# Patient Record
Sex: Male | Born: 1975 | Race: White | Hispanic: No | Marital: Single | Smoking: Never smoker
Health system: Southern US, Community
[De-identification: ages and names within clinical notes are randomized; demographics above are authoritative.]

## PROBLEM LIST (undated history)

## (undated) DIAGNOSIS — I10 Essential (primary) hypertension: Secondary | ICD-10-CM

## (undated) DIAGNOSIS — E785 Hyperlipidemia, unspecified: Secondary | ICD-10-CM

## (undated) HISTORY — DX: Hyperlipidemia, unspecified: E78.5

---

## 2007-06-12 ENCOUNTER — Emergency Department (HOSPITAL_COMMUNITY): Admission: EM | Admit: 2007-06-12 | Discharge: 2007-06-12 | Payer: Self-pay | Admitting: Emergency Medicine

## 2019-09-24 ENCOUNTER — Other Ambulatory Visit: Payer: Self-pay

## 2019-09-24 ENCOUNTER — Ambulatory Visit
Admission: RE | Admit: 2019-09-24 | Discharge: 2019-09-24 | Disposition: A | Discharge status: Home / Self Care | Payer: BC Managed Care – PPO | Source: Ambulatory Visit | Attending: Family Medicine | Admitting: Family Medicine

## 2019-09-24 ENCOUNTER — Other Ambulatory Visit: Payer: Self-pay | Admitting: Family Medicine

## 2019-09-24 DIAGNOSIS — G8929 Other chronic pain: Secondary | ICD-10-CM

## 2019-09-24 DIAGNOSIS — M25512 Pain in left shoulder: Secondary | ICD-10-CM

## 2019-10-02 ENCOUNTER — Encounter: Payer: Self-pay | Admitting: Physical Therapy

## 2019-10-02 ENCOUNTER — Ambulatory Visit: Payer: BC Managed Care – PPO | Attending: Family Medicine | Admitting: Physical Therapy

## 2019-10-02 ENCOUNTER — Other Ambulatory Visit: Payer: Self-pay

## 2019-10-02 DIAGNOSIS — M25512 Pain in left shoulder: Secondary | ICD-10-CM

## 2019-10-02 DIAGNOSIS — M25612 Stiffness of left shoulder, not elsewhere classified: Secondary | ICD-10-CM

## 2019-10-02 NOTE — Patient Instructions (Signed)
Access Code: [email or web address]  URL: [email or web address]  Date: 10/02/2019  Prepared by: Lum Babe   Exercises Doorway Pec Stretch at 90 Degrees Abduction - 5 reps - 1 sets - 15 hold - 2x daily - 7x weekly Sleeper Stretch - 5 reps - 1 sets - 15 hold - 2x daily - 7x weekly Standing Shoulder Internal Rotation Stretch Behind Back - 5 reps - 1 sets - 15 hold - 2x daily - 7x weekly

## 2019-10-02 NOTE — Therapy (Signed)
[facility] [place] [address] [place], [place], [address] Phone: [phone]   Fax:  [phone]  Physical Therapy Evaluation  Patient Details  Name: Anthony Dean MRN: [id number] Date of Birth: 1976/10/17 Referring Provider (PT): TL Leavy Cella   Encounter Date: 10/02/2019  PT End of Session - 10/02/19 0828    Visit Number  1    Date for PT Re-Evaluation  12/02/19    PT Start Time  0756    PT Stop Time  0835    PT Time Calculation (min)  39 min    Activity Tolerance  Patient tolerated treatment well    Behavior During Therapy  [facility] for tasks assessed/performed       History reviewed. No pertinent past medical history.  History reviewed. No pertinent surgical history.  There were no vitals filed for this visit.   Subjective Assessment - 10/02/19 0756    Subjective  Patient reports that he has had some left shoulder pain over the years, reports recently the pain has been more consistent and worse at night.  He is right handed, x-rays were negative.    Patient Stated Goals  less pain and sleep better    Currently in Pain?  Yes    Pain Score  3     Pain Location  Shoulder    Pain Orientation  Left;Anterior    Pain Descriptors / Indicators  Aching    Pain Type  Acute pain    Pain Radiating Towards  denies    Pain Onset  More than a month ago    Pain Frequency  Constant    Aggravating Factors   reaching sleeping, pain up to 8/10 reports difficulty sleeping    Pain Relieving Factors  not moving it, prednisone at best pain a 3/10    Effect of Pain on Daily Activities  difficulty sleeping         [facility] PT Assessment - 10/02/19 0001      Assessment   Medical Diagnosis  left shoulder pain    Referring Provider (PT)  TL Leavy Cella    Onset Date/Surgical Date  09/18/19    Hand Dominance  Right    Prior Therapy  no      Precautions   Precautions  None      Balance Screen   Has the patient fallen in the past 6 months  No     Has the patient had a decrease in activity level because of a fear of falling?   No    Is the patient reluctant to leave their home because of a fear of falling?   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    Leisure  no exercise      Posture/Postural Control   Posture Comments  fwd head, rounded shoulders      ROM / Strength   AROM / PROM / Strength  AROM;Strength      AROM   AROM Assessment Site  Shoulder    Right/Left Shoulder  Left    Left Shoulder Flexion  150 Degrees    Left Shoulder ABduction  170 Degrees    Left Shoulder Internal Rotation  55 Degrees  Left Shoulder External Rotation  85 Degrees      Strength   Overall Strength Comments  ER and IR 4-/5 with pain, flexion 4+/5, abduction 4-/5 with crepitus and pain      Palpation   Palpation comment  has tightness but not tender      Special Tests   Other special tests  negative biceps test, and impingement test, pain and weak with empty can test                Objective measurements completed on examination: See above findings.      [facility] Adult PT Treatment/Exercise - 10/02/19 0001      Modalities   Modalities  Iontophoresis      Iontophoresis   Type of Iontophoresis  Dexamethasone    Location  left anteriorlateral shoulder    Dose  80 mA    Time  4 hour patch               PT Short Term Goals - 10/02/19 0834      PT SHORT TERM GOAL #1   Title  indepednent with initial HEP    Time  2    Period  Weeks    Status  New        PT Long Term Goals - 10/02/19 16100834      PT LONG TERM GOAL #1   Title  independent with advanced HEP    Time  8    Period  Weeks    Status  New      PT LONG TERM GOAL #2   Title  understand posture and body mechanics    Time  8    Period  Weeks     Status  New      PT LONG TERM GOAL #3   Title  decrease pain 50%    Time  8    Period  Weeks    Status  New      PT LONG TERM GOAL #4   Title  increase left shoulder ROM of flexion to 170 degrees    Period  Weeks    Status  New      PT LONG TERM GOAL #5   Title  increase IR of left shoulder to 80 degrees    Time  8    Period  Weeks    Status  New             Plan - 10/02/19 [id number]    Clinical Impression Statement  Patient reports that he has had pain in the left shoulder previoulsy but would resolve on its own, report that over the past few months he has had more pain and significant pain at night with difficulty sleeping.  He is right handed, x-rays were negative.  He has some limitations in flexion and ER/IR, he is weak with flexion and ER/IR, negative biceps tests, impingement test was negative, empty can test was painful and weak.  Feel like this could be more of an RC tendonitis issue.    Stability/Clinical Decision Making  Stable/Uncomplicated    Clinical Decision Making  Low    Rehab Potential  Good    PT Frequency  2x / week    PT Duration  8 weeks    PT Treatment/Interventions  ADLs/Self Care Home Management;Cryotherapy;Electrical Stimulation;Iontophoresis 4mg /ml Dexamethasone;Moist Heat;Ultrasound;Therapeutic activities;Therapeutic exercise;Neuromuscular re-education;Manual techniques;Patient/family education    PT Next Visit Plan  he is to try felxibility this week and next week  may start scapular stabilization, see if he thought the ionto helped    Consulted and Agree with Plan of Care  Patient       Patient will benefit from skilled therapeutic intervention in order to improve the following deficits and impairments:  Pain, Improper body mechanics, Postural dysfunction, Decreased range of motion, Decreased strength, Impaired UE functional use, Impaired flexibility  Visit Diagnosis: Acute pain of left shoulder - Plan: PT plan of care cert/re-cert  Stiffness of  left shoulder, not elsewhere classified - Plan: PT plan of care cert/re-cert     Problem List There are no active problems to display for this patient.   Sumner Boast., PT 10/02/2019, 8:39 AM  [place] [place], [place], [address] Phone: [phone]   Fax:  [phone]  Name: Anthony Dean MRN: [id number] Date of Birth: August 16, 1976

## 2019-10-09 ENCOUNTER — Encounter: Payer: Self-pay | Admitting: Physical Therapy

## 2019-10-09 ENCOUNTER — Ambulatory Visit: Payer: BC Managed Care – PPO | Admitting: Physical Therapy

## 2019-10-09 ENCOUNTER — Other Ambulatory Visit: Payer: Self-pay

## 2019-10-09 DIAGNOSIS — M25512 Pain in left shoulder: Secondary | ICD-10-CM | POA: Diagnosis not present

## 2019-10-09 DIAGNOSIS — M25612 Stiffness of left shoulder, not elsewhere classified: Secondary | ICD-10-CM

## 2019-10-09 NOTE — Therapy (Signed)
[place], [place], [address] Phone: [phone]   Fax:  [phone]  Physical Therapy Treatment  Patient Details  Name: Anthony Dean MRN: [id number] Date of Birth: July 31, 1976 Referring Provider (PT): TL Luciana Axe   Encounter Date: 10/09/2019  PT End of Session - 10/09/19 0840    Visit Number  2    Date for PT Re-Evaluation  12/02/19    PT Start Time  0800    PT Stop Time  0841    PT Time Calculation (min)  41 min       History reviewed. No pertinent past medical history.  History reviewed. No pertinent surgical history.  There were no vitals filed for this visit.  Subjective Assessment - 10/09/19 0803    Subjective  "Pretty good" No change since evaluation,  Patch did help a little that day.    Currently in Pain?  Yes    Pain Score  3     Pain Location  Shoulder    Pain Orientation  Left                       [facility] Adult PT Treatment/Exercise - 10/09/19 0001      Exercises   Exercises  Shoulder      Shoulder Exercises: Standing   External Rotation  Left;20 reps;Theraband    Theraband Level (Shoulder External Rotation)  Level 2 (Red)    Internal Rotation  Strengthening;Left;20 reps;Theraband    Theraband Level (Shoulder Internal Rotation)  Level 2 (Red)    Flexion  Strengthening;Both;20 reps;Weights    Shoulder Flexion Weight (lbs)  2    ABduction  Strengthening;20 reps;Both;Weights    Shoulder ABduction Weight (lbs)  2    Extension  Strengthening;Both;20 reps;Weights    Extension Weight (lbs)  10    Row  Strengthening;Both;Theraband;20 reps    Theraband Level (Shoulder Row)  Level 4 (Blue)    Other Standing Exercises  AAROM cane flex, ext, IR up back x10       Shoulder Exercises: ROM/Strengthening   UBE (Upper Arm Bike)  L3 2 min each way     Other ROM/Strengthening Exercises  Rows & Lats 20lb 2x10       Modalities   Modalities  Iontophoresis      Iontophoresis   Type of Iontophoresis  Dexamethasone    Location  left anteriorlateral shoulder    Dose  80 mA    Time  4 hour patch      Manual Therapy   Manual Therapy  Passive ROM;Joint mobilization    Manual therapy comments  Good PROM some pain at end range    Joint Mobilization  GH grades 1-2    Passive ROM  L shoulder all directions               PT Short Term Goals - 10/09/19 0805      PT SHORT TERM GOAL #1   Title  indepednent with initial HEP    Status  Achieved        PT Long Term Goals - 10/02/19 4259      PT LONG TERM GOAL #1   Title  independent with advanced HEP    Time  8    Period  Weeks    Status  New      PT LONG TERM GOAL #2   Title  understand posture and body mechanics    Time  8    Period  Weeks    Status  New      PT LONG TERM GOAL #3   Title  decrease pain 50%    Time  8    Period  Weeks    Status  New      PT LONG TERM GOAL #4   Title  increase left shoulder ROM of flexion to 170 degrees    Period  Weeks    Status  New      PT LONG TERM GOAL #5   Title  increase IR of left shoulder to 80 degrees    Time  8    Period  Weeks    Status  New            Plan - 10/09/19 0841    Clinical Impression Statement  Pt tolerated an initial progression to TE well. Tolerable pain reported with UBE and L shoulder internal rotation. Postural cues needed with seated rows. PROM is good, some difficulty relaxing and allowing passive motion. Some pain reported at the end range of flexion that lessened after Jt mobs.    Stability/Clinical Decision Making  Stable/Uncomplicated    Rehab Potential  Good    PT Frequency  2x / week    PT Duration  8 weeks    PT Treatment/Interventions  ADLs/Self Care Home Management;Cryotherapy;Electrical Stimulation;Iontophoresis 4mg /ml Dexamethasone;Moist Heat;Ultrasound;Therapeutic activities;Therapeutic exercise;Neuromuscular re-education;Manual techniques;Patient/family education    PT Next Visit Plan   scapular stabilization continue ionto       Patient will benefit from skilled therapeutic intervention in order to improve the following deficits and impairments:  Pain, Improper body mechanics, Postural dysfunction, Decreased range of motion, Decreased strength, Impaired UE functional use, Impaired flexibility  Visit Diagnosis: Stiffness of left shoulder, not elsewhere classified  Acute pain of left shoulder     Problem List There are no active problems to display for this patient.   , PTA 10/09/2019, 8:44 AM  [facility] [place] [address] [place], [place], [place] Phone: [phone]   Fax:  [phone]  Name: Anthony Dean MRN: Raliegh Ip Date of Birth: [birth year]

## 2019-10-16 ENCOUNTER — Encounter: Payer: Self-pay | Admitting: Physical Therapy

## 2019-10-16 ENCOUNTER — Ambulatory Visit: Payer: BC Managed Care – PPO | Admitting: Physical Therapy

## 2019-10-16 ENCOUNTER — Other Ambulatory Visit: Payer: Self-pay

## 2019-10-16 DIAGNOSIS — M25512 Pain in left shoulder: Secondary | ICD-10-CM

## 2019-10-16 DIAGNOSIS — M25612 Stiffness of left shoulder, not elsewhere classified: Secondary | ICD-10-CM

## 2019-10-16 NOTE — Therapy (Signed)
[place] [address] [place], [place], [address] Phone: [phone]   Fax:  [phone]  Physical Therapy Treatment  Patient Details  Name: Anthony Dean MRN: [id number] Date of Birth: 09-15-1976 Referring Provider (PT): TL Luciana Axe   Encounter Date: 10/16/2019  PT End of Session - 10/16/19 0836    Visit Number  3    Date for PT Re-Evaluation  12/02/19    PT Start Time  0800    PT Stop Time  0845    PT Time Calculation (min)  45 min       History reviewed. No pertinent past medical history.  History reviewed. No pertinent surgical history.  There were no vitals filed for this visit.  Subjective Assessment - 10/16/19 0800    Subjective  "No more pain than usual"    Currently in Pain?  Yes    Pain Score  4     Pain Location  Shoulder    Pain Orientation  Left                       [facility] Adult PT Treatment/Exercise - 10/16/19 0001      Exercises   Exercises  Shoulder      Shoulder Exercises: Seated   Other Seated Exercises  Bent over rows, ext, and rev flys 5lb 2x10       Shoulder Exercises: Standing   External Rotation  Left;20 reps;Theraband    Theraband Level (Shoulder External Rotation)  Level 3 (Green)    Flexion  Strengthening;Both;20 reps;Weights    Shoulder Flexion Weight (lbs)  3    ABduction  Strengthening    Shoulder ABduction Weight (lbs)  3    Other Standing Exercises  3# PNF 2 sets 10   3# 4 pt stab on wall 12 repes each   Other Standing Exercises  pulley IR/ER Left 2 sets 10   ER 10# IR20#     Shoulder Exercises: ROM/Strengthening   Nustep  L4 x 6 min    Other ROM/Strengthening Exercises  Rows & Lats 25# 2x10       Modalities   Modalities  Iontophoresis      Iontophoresis   Type of Iontophoresis  Dexamethasone    Location  left anteriorlateral shoulder    Dose  80 mA    Time  4 hour patch      Manual Therapy   Manual Therapy  Passive ROM;Joint mobilization     Manual therapy comments  Good PROM some pain at end range    Joint Mobilization  GH grades 1-2    Passive ROM  L shoulder all directions               PT Short Term Goals - 10/16/19 2694      PT SHORT TERM GOAL #1   Title  indepednent with initial HEP    Status  Achieved        PT Long Term Goals - 10/02/19 0834      PT LONG TERM GOAL #1   Title  independent with advanced HEP    Time  8    Period  Weeks    Status  New      PT LONG TERM GOAL #2   Title  understand posture and body mechanics    Time  8    Period  Weeks    Status  New      PT  LONG TERM GOAL #3   Title  decrease pain 50%    Time  8    Period  Weeks    Status  New      PT LONG TERM GOAL #4   Title  increase left shoulder ROM of flexion to 170 degrees    Period  Weeks    Status  New      PT LONG TERM GOAL #5   Title  increase IR of left shoulder to 80 degrees    Time  8    Period  Weeks    Status  New            Plan - 10/16/19 0837    Clinical Impression Statement  increased wt and reps, some pain at end range but tolerated. Full PROM but again pain at end range. Pt consistant at point of pain at AC joint. Educ on posture and avoided prolonged fwd activities. STG met.    PT Treatment/Interventions  ADLs/Self Care Home Management;Cryotherapy;Electrical Stimulation;Iontophoresis 4mg/ml Dexamethasone;Moist Heat;Ultrasound;Therapeutic activities;Therapeutic exercise;Neuromuscular re-education;Manual techniques;Patient/family education    PT Next Visit Plan  scapular stabilization continue ionto. Increase HEP       Patient will benefit from skilled therapeutic intervention in order to improve the following deficits and impairments:  Pain, Improper body mechanics, Postural dysfunction, Decreased range of motion, Decreased strength, Impaired UE functional use, Impaired flexibility  Visit Diagnosis: Acute pain of left shoulder  Stiffness of left shoulder, not elsewhere  classified     Problem List There are no active problems to display for this patient.   Ronald G Pemberton PTA Angela Payseur PTA 10/16/2019, 8:39 AM  [facility] [place] [address] [place], [place], [address] Phone: [phone]   Fax:  [phone]  Name: Anthony Dean MRN: [id number] Date of Birth: 01/09/1976   

## 2019-10-26 ENCOUNTER — Other Ambulatory Visit: Payer: Self-pay

## 2019-10-26 ENCOUNTER — Ambulatory Visit: Payer: BC Managed Care – PPO | Attending: Family Medicine | Admitting: Physical Therapy

## 2019-10-26 DIAGNOSIS — M25612 Stiffness of left shoulder, not elsewhere classified: Secondary | ICD-10-CM | POA: Diagnosis present

## 2019-10-26 DIAGNOSIS — M25512 Pain in left shoulder: Secondary | ICD-10-CM

## 2019-10-26 NOTE — Therapy (Signed)
[facility] [place] [address] [place], , [address] Phone: [phone]   Fax:  [phone]  Physical Therapy Treatment  Patient Details  Name: Anthony Dean MRN: [id number] Date of Birth: 07/11/1976 Referring Provider (PT): TL Boyd   Encounter Date: 10/26/2019  PT End of Session - 10/26/19 0849    Visit Number  4    Date for PT Re-Evaluation  12/02/19    PT Start Time  0800    PT Stop Time  0845    PT Time Calculation (min)  45 min       No past medical history on file.  No past surgical history on file.  There were no vitals filed for this visit.  Subjective Assessment - 10/26/19 0805    Subjective  "doing okay, shoulder is still hurting like crazy". pt states that pain has not decreased and it still keeps him up at night.    Currently in Pain?  Yes    Pain Score  4     Pain Location  Shoulder    Pain Orientation  Left         [facility] PT Assessment - 10/26/19 0001      AROM   Left Shoulder Flexion  156 Degrees    Left Shoulder Internal Rotation  57 Degrees                   [facility] Adult PT Treatment/Exercise - 10/26/19 0001      Shoulder Exercises: Standing   External Rotation  Left;Theraband;10 reps;20 reps    Theraband Level (Shoulder External Rotation)  Level 3 (Green)    Internal Rotation  Strengthening;Left;20 reps;Theraband;10 reps    Theraband Level (Shoulder Internal Rotation)  Level 3 (Green)    Flexion  Strengthening;Both;20 reps;Weights;10 reps    Shoulder Flexion Weight (lbs)  3    ABduction  Strengthening;Both;10 reps;20 reps;Weights    Shoulder ABduction Weight (lbs)  3    Other Standing Exercises  AAROM flexion with cane x20      Shoulder Exercises: ROM/Strengthening   Nustep  L4 x 6 min    Other ROM/Strengthening Exercises  Rows & Lats 25# 2x15      Iontophoresis   Type of Iontophoresis  Dexamethasone    Location  left anteriorlateral shoulder    Dose  80 mA    Time  4 hour patch      Manual Therapy   Manual Therapy  Passive ROM    Manual therapy comments  Good PROM some pain at end range    Passive ROM  L shoulder all directions               PT Short Term Goals - 10/16/19 0838      PT SHORT TERM GOAL #1   Title  indepednent with initial HEP    Status  Achieved        PT Long Term Goals - 10/26/19 0806      PT LONG TERM GOAL #1   Title  independent with advanced HEP    Status  Achieved      PT LONG TERM GOAL #2   Title  understand posture and body mechanics    Status  On-going      PT LONG TERM GOAL #3   Title  decrease pain 50%    Status  On-going      PT LONG TERM GOAL #4   Status  Partially Met        PT LONG TERM GOAL #5   Title  increase IR of left shoulder to 80 degrees    Status  On-going            Plan - 10/26/19 0845    Clinical Impression Statement  pt able to increase reps with rows, lat pulls, and ER/IR. pt is making progress with shoulder flexion, but IR is about the same. pt was measured in supine. PROM looks good with some pain at the end range. pt agreed to the ionto patch and says it seems to be working.    Stability/Clinical Decision Making  Stable/Uncomplicated    Rehab Potential  Good    PT Frequency  2x / week    PT Duration  8 weeks    PT Next Visit Plan  scapular stabilization continue ionto. Increase HEP       Patient will benefit from skilled therapeutic intervention in order to improve the following deficits and impairments:  Pain, Improper body mechanics, Postural dysfunction, Decreased range of motion, Decreased strength, Impaired UE functional use, Impaired flexibility  Visit Diagnosis: Acute pain of left shoulder  Stiffness of left shoulder, not elsewhere classified     Problem List There are no active problems to display for this patient.   Savannah Benton, SPTA 10/26/2019, 8:50 AM  [facility] [address] , [place], [address] Phone: [phone]   Fax:  [phone]  Name: Siyon Asche MRN: [id number] Date of Birth: 06/02/1976   

## 2019-11-02 ENCOUNTER — Ambulatory Visit: Payer: BC Managed Care – PPO | Admitting: Physical Therapy

## 2019-11-02 ENCOUNTER — Encounter: Payer: Self-pay | Admitting: Physical Therapy

## 2019-11-02 ENCOUNTER — Other Ambulatory Visit: Payer: Self-pay

## 2019-11-02 DIAGNOSIS — M25512 Pain in left shoulder: Secondary | ICD-10-CM | POA: Diagnosis not present

## 2019-11-02 DIAGNOSIS — M25612 Stiffness of left shoulder, not elsewhere classified: Secondary | ICD-10-CM

## 2019-11-02 NOTE — Therapy (Signed)
[place] [address] [place], [place], [address] Phone: [phone]   Fax:  [phone]  Physical Therapy Treatment  Patient Details  Name: Anthony Dean MRN: [id number] Date of Birth: [birth year] Referring Provider (PT): TL Luciana Axe   Encounter Date: 11/02/2019  PT End of Session - 11/02/19 0840    Visit Number  5    Date for PT Re-Evaluation  12/02/19    PT Start Time  0805    PT Stop Time  0845    PT Time Calculation (min)  40 min    Activity Tolerance  Patient tolerated treatment well    Behavior During Therapy  [facility] for tasks assessed/performed       History reviewed. No pertinent past medical history.  History reviewed. No pertinent surgical history.  There were no vitals filed for this visit.  Subjective Assessment - 11/02/19 0809    Subjective  "Still about the same"    Currently in Pain?  Yes    Pain Score  4     Pain Location  Shoulder    Pain Orientation  Left         [facility] PT Assessment - 11/02/19 0001      AROM   Left Shoulder Flexion  172 Degrees    Left Shoulder Internal Rotation  47 Degrees                   [facility] Adult PT Treatment/Exercise - 11/02/19 0001      Shoulder Exercises: Standing   Horizontal ABduction  Strengthening;Both;Theraband;20 reps    Theraband Level (Shoulder Horizontal ABduction)  Level 4 (Blue)    External Rotation  Left;10 reps;20 reps;Weights    External Rotation Weight (lbs)  5    Internal Rotation  Strengthening;Left;20 reps;Theraband;10 reps    Internal Rotation Weight (lbs)  5    Other Standing Exercises  OHP 5lb 2x10, Flex abd combo 4lb 2x10 each     Other Standing Exercises  4 way external rotation 4lb x10 each       Shoulder Exercises: ROM/Strengthening   UBE (Upper Arm Bike)  L3 3 min each way     Other ROM/Strengthening Exercises  Rows & Lats 55# 2x10      Shoulder Exercises: Stretch   Other Shoulder Stretches  Sleepers stretch  LUE  4x10 sec       Iontophoresis   Type of Iontophoresis  Dexamethasone    Location  left anteriorlateral shoulder    Dose  80 mA    Time  4 hour patch               PT Short Term Goals - 10/16/19 2297      PT SHORT TERM GOAL #1   Title  indepednent with initial HEP    Status  Achieved        PT Long Term Goals - 10/26/19 0806      PT LONG TERM GOAL #1   Title  independent with advanced HEP    Status  Achieved      PT LONG TERM GOAL #2   Title  understand posture and body mechanics    Status  On-going      PT LONG TERM GOAL #3   Title  decrease pain 50%    Status  On-going      PT LONG TERM GOAL #4   Status  Partially Met      PT LONG TERM  GOAL #5   Title  increase IR of left shoulder to 80 degrees    Status  On-going            Plan - 11/02/19 0841    Clinical Impression Statement  Pt reports no improvement  with his pain since starting therapy. He has progressed increasing L shoulder flexion AROM. No improvement with active internal rotation. He completed all interventions well with good strength and ROM. Advised pt to call his MD regarding his pain.    Stability/Clinical Decision Making  Stable/Uncomplicated    Rehab Potential  Good    PT Frequency  2x / week    PT Duration  8 weeks    PT Treatment/Interventions  ADLs/Self Care Home Management;Cryotherapy;Electrical Stimulation;Iontophoresis 29m/ml Dexamethasone;Moist Heat;Ultrasound;Therapeutic activities;Therapeutic exercise;Neuromuscular re-education;Manual techniques;Patient/family education    PT Next Visit Plan  2 week hold, pt reports that he will call his MD. Will D/C if he does not call and schedule appointment within 2 weeks.       Patient will benefit from skilled therapeutic intervention in order to improve the following deficits and impairments:  Pain, Improper body mechanics, Postural dysfunction, Decreased range of motion, Decreased strength, Impaired UE functional use, Impaired  flexibility  Visit Diagnosis: Acute pain of left shoulder  Stiffness of left shoulder, not elsewhere classified     Problem List There are no problems to display for this patient.   RScot Jun PTA 11/02/2019, 8:44 AM  [place] [address] [address]Phone: [phone]  Fax:  [phone] Name: Anthony FaulMRN: [id number] of Birth: 705/30/1977

## 2019-11-08 ENCOUNTER — Ambulatory Visit: Payer: BC Managed Care – PPO | Admitting: Physical Therapy

## 2019-11-13 ENCOUNTER — Ambulatory Visit: Payer: BC Managed Care – PPO | Admitting: Physical Therapy

## 2020-02-16 ENCOUNTER — Ambulatory Visit: Payer: BC Managed Care – PPO | Attending: Internal Medicine

## 2020-02-16 DIAGNOSIS — Z23 Encounter for immunization: Secondary | ICD-10-CM

## 2020-02-16 NOTE — Progress Notes (Signed)
   Covid-19 Vaccination Clinic  Name:  Andric Kerce    MRN: [id number] DOB: 02/09/1976  02/16/2020  Mr. Buening was observed post Covid-19 immunization for 15 minutes without incident. He was provided with Vaccine Information Sheet and instruction to access the V-Safe system.   Mr. Malter was instructed to call 911 with any severe reactions post vaccine: Marland Kitchen Difficulty breathing  . Swelling of face and throat  . A fast heartbeat  . A bad rash all over body  . Dizziness and weakness   Immunizations Administered    Name Date Dose VIS Date Route   Pfizer COVID-19 Vaccine 02/16/2020  2:04 PM 0.3 mL 11/02/2019 Intramuscular   Manufacturer: [employer], [employer]   Lot: [id number]   NDC: [address]-0     s

## 2020-03-11 ENCOUNTER — Ambulatory Visit: Payer: BC Managed Care – PPO | Attending: Internal Medicine

## 2020-03-11 DIAGNOSIS — Z23 Encounter for immunization: Secondary | ICD-10-CM

## 2020-03-11 NOTE — Progress Notes (Signed)
   Covid-19 Vaccination Clinic  Name:  Anthony Dean    MRN: [id number] DOB: 02-28-1976  03/11/2020  Anthony Dean was observed post Covid-19 immunization for 15 minutes without incident. He was provided with Vaccine Information Sheet and instruction to access the V-Safe system.   Anthony Dean was instructed to call 911 with any severe reactions post vaccine: Marland Kitchen Difficulty breathing  . Swelling of face and throat  . A fast heartbeat  . A bad rash all over body  . Dizziness and weakness   Immunizations Administered    Name Date Dose VIS Date Route   Pfizer COVID-19 Vaccine 03/11/2020  3:36 PM 0.3 mL 01/16/2019 Intramuscular   Manufacturer: [employer], [employer]   Lot: [id number]   NDC: [address]-9

## 2021-04-11 IMAGING — CR DG SHOULDER 2+V*L*
2 series · 2 of 2 positions shown · non-contrast
Comparison: None.

CLINICAL DATA: Pain

EXAM:
LEFT SHOULDER - 2+ VIEW

[w shoulder grashey left]
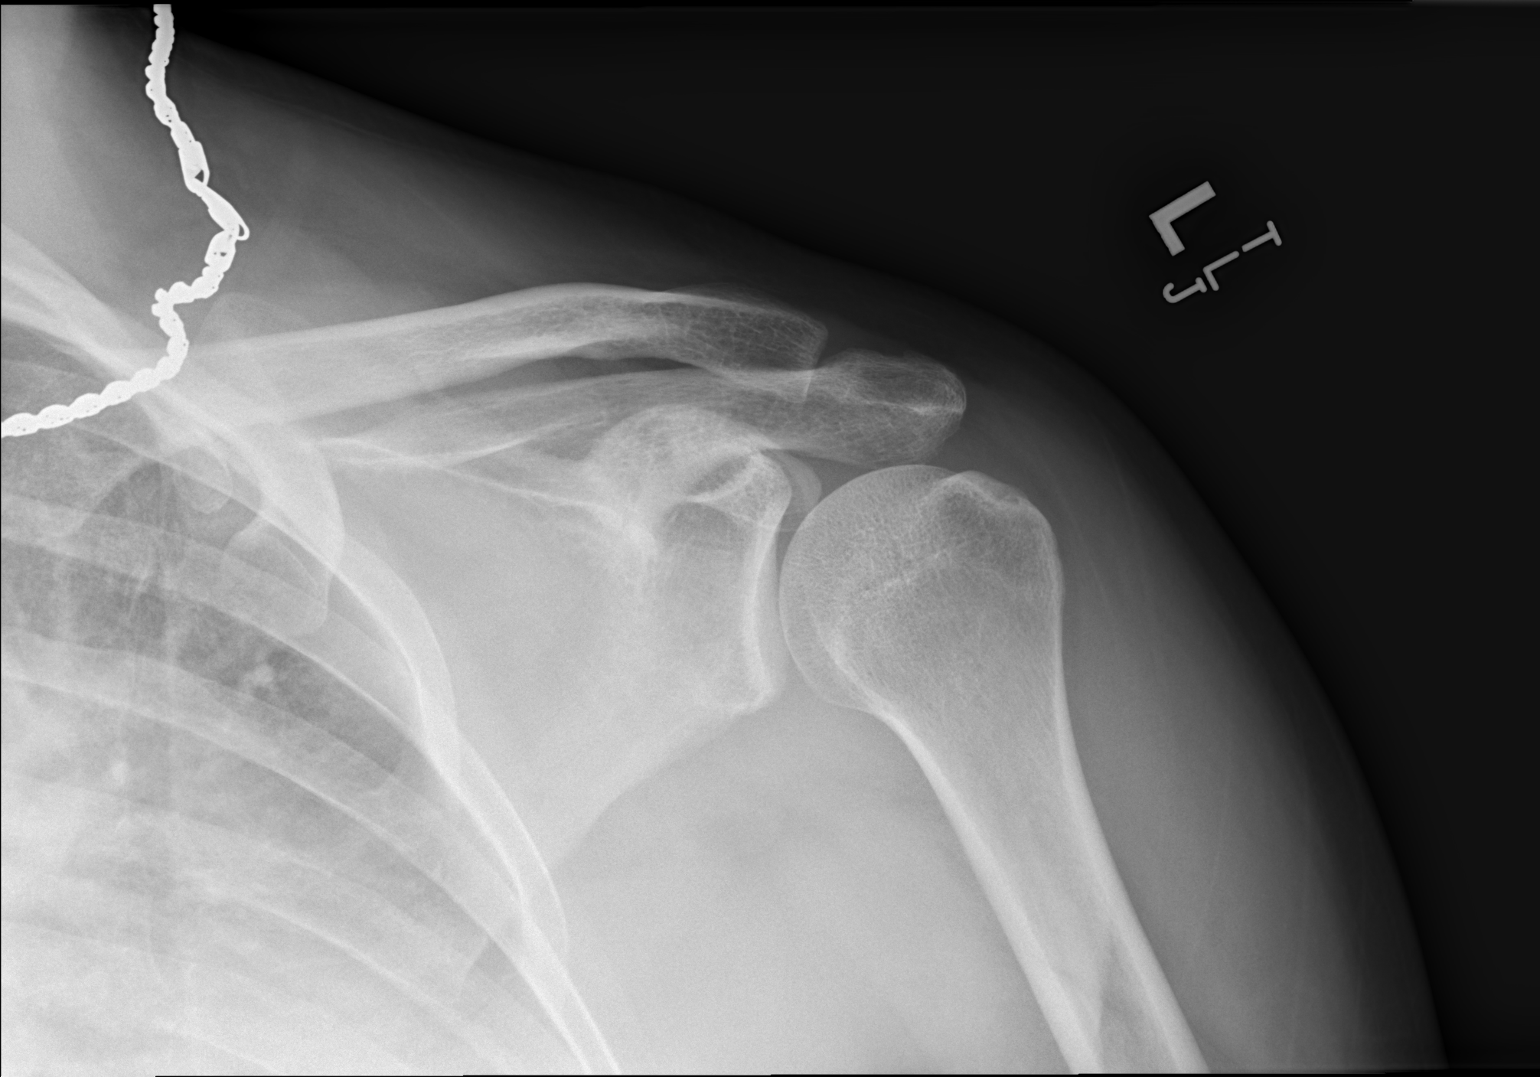

[w shoulder y-view left]
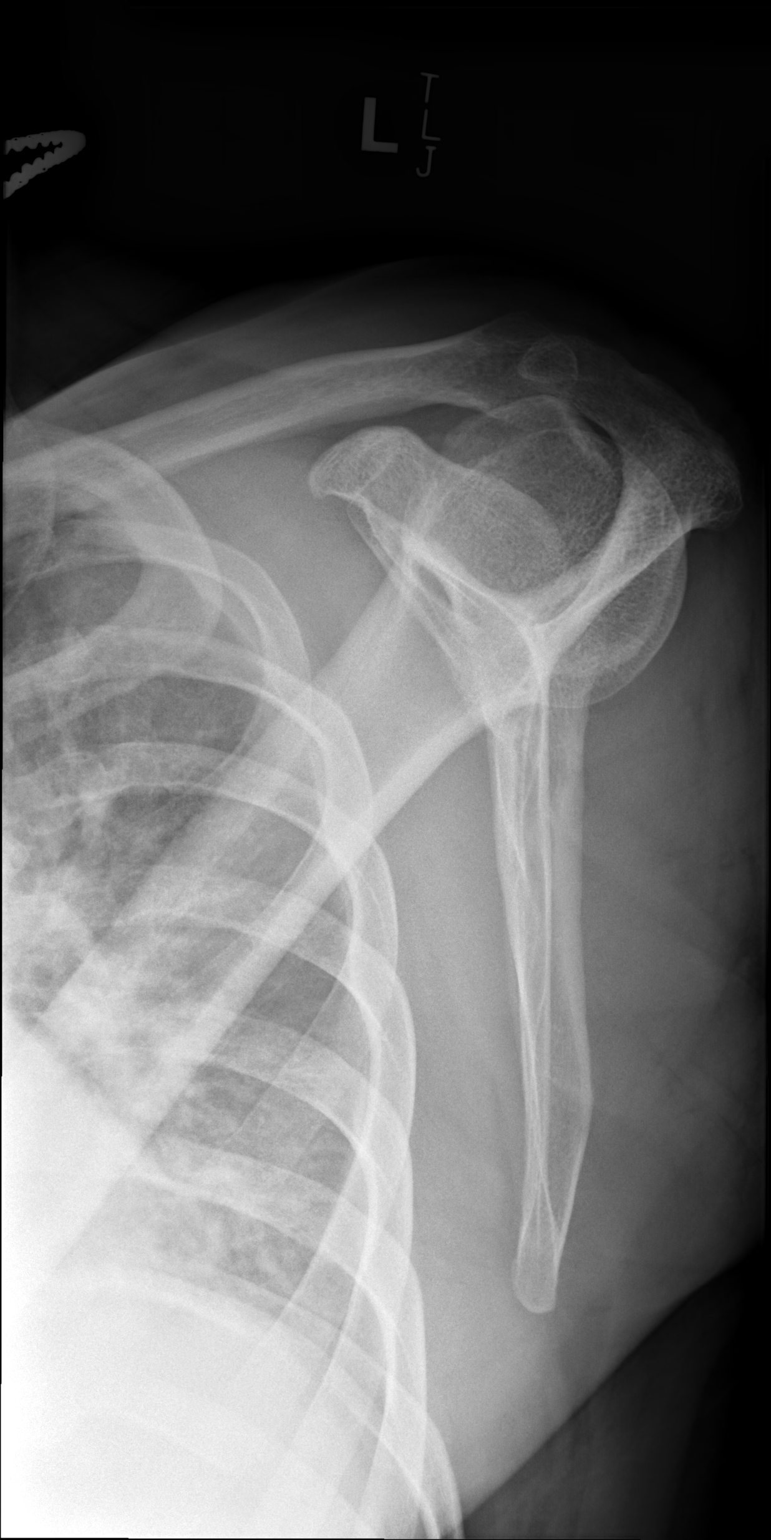

[2 of 2 positions shown; findings below may reference images not displayed]

FINDINGS: There is no evidence of fracture or dislocation. There is no
evidence of arthropathy or other focal bone abnormality. Soft
tissues are unremarkable.
IMPRESSION: No significant findings.

## 2024-06-11 NOTE — Progress Notes (Signed)
 Interval History: Anthony Dean is a 48 y.o. male presenting today 12 weeks s/p below procedure. Pt states his Right Shoulder is doing good. No issues with P.T. A little bit of pain.   Date of Surgery: 03/01/2024   Pre-op Diagnosis:  Right shoulder             Long head of the biceps tendon rupture   Postoperative Diagnosis:    Right shoulder             Long head of the biceps tendon rupture             Degenerative labral fraying             Partial-thickness subscapularis tear             Subacromial bursitis   Procedure(s): Right shoulder arthroscopy             Rotator cuff repair             Extensive debridement Right shoulder open subpectoral biceps tenodesis    Exam:  Forward flexion to 170 degrees ER 70 IR T8 Mild pain and very mild weakness with resisted abduction. Speed test is negative 5/5 subscapularis strength, rotator cuff and biceps   Imaging: No new imaging was obtained today.  Assessment: 12 weeks s/p above procedure  Plan:  Patient is making good, gradual progress postoperatively. His pain is well controlled and his range of motion is improving. He was advised to continue with physical therapy and home exercises. I advised him to avoid activities that cause sharp pain. Follow up as needed  This document serves as a record of services personally performed by [place] Case, MD. It was created on their behalf by , Morgan Gee  a trained medical scribe. The creation of this record is the provider's dictation and/or activities during the visit.   Electronically signed by: Morgan Gee, Scribe 06/11/2024 8:07 AM  I agree the documentation is accurate and complete.  Electronically signed by: [place] Case, MD 06/11/2024 8:07 AM

## 2024-06-23 ENCOUNTER — Emergency Department (HOSPITAL_BASED_OUTPATIENT_CLINIC_OR_DEPARTMENT_OTHER)

## 2024-06-23 ENCOUNTER — Encounter (HOSPITAL_BASED_OUTPATIENT_CLINIC_OR_DEPARTMENT_OTHER): Payer: Self-pay

## 2024-06-23 ENCOUNTER — Other Ambulatory Visit: Payer: Self-pay

## 2024-06-23 ENCOUNTER — Emergency Department (HOSPITAL_BASED_OUTPATIENT_CLINIC_OR_DEPARTMENT_OTHER)
Admission: EM | Admit: 2024-06-23 | Discharge: 2024-06-23 | Disposition: A | Discharge status: Home / Self Care | Attending: Emergency Medicine | Admitting: Emergency Medicine

## 2024-06-23 DIAGNOSIS — I10 Essential (primary) hypertension: Secondary | ICD-10-CM | POA: Diagnosis not present

## 2024-06-23 DIAGNOSIS — R079 Chest pain, unspecified: Secondary | ICD-10-CM | POA: Diagnosis present

## 2024-06-23 DIAGNOSIS — R7989 Other specified abnormal findings of blood chemistry: Secondary | ICD-10-CM | POA: Diagnosis not present

## 2024-06-23 DIAGNOSIS — R61 Generalized hyperhidrosis: Secondary | ICD-10-CM | POA: Insufficient documentation

## 2024-06-23 HISTORY — DX: Essential (primary) hypertension: I10

## 2024-06-23 LAB — COMPREHENSIVE METABOLIC PANEL WITH GFR
ALT: 17 U/L (ref 0–44)
AST: 17 U/L (ref 15–41)
Albumin: 4.5 g/dL (ref 3.5–5.0)
Alkaline Phosphatase: 56 U/L (ref 38–126)
Anion gap: 13 (ref 5–15)
BUN: 22 mg/dL — ABNORMAL HIGH (ref 6–20)
CO2: 23 mmol/L (ref 22–32)
Calcium: 8.7 mg/dL — ABNORMAL LOW (ref 8.9–10.3)
Chloride: 104 mmol/L (ref 98–111)
Creatinine, Ser: 1.29 mg/dL — ABNORMAL HIGH (ref 0.61–1.24)
GFR, Estimated: >60 mL/min (ref >60)
Glucose, Bld: 106 mg/dL — ABNORMAL HIGH (ref 70–99)
Potassium: 3.3 mmol/L — ABNORMAL LOW (ref 3.5–5.1)
Sodium: 139 mmol/L (ref 135–145)
Total Bilirubin: 0.4 mg/dL (ref 0.0–1.2)
Total Protein: 7 g/dL (ref 6.5–8.1)

## 2024-06-23 LAB — CBC WITH DIFFERENTIAL/PLATELET
Abs Immature Granulocytes: 0.08 K/uL — ABNORMAL HIGH (ref 0.00–0.07)
Basophils Absolute: 0.1 K/uL (ref 0.0–0.1)
Basophils Relative: 1 %
Eosinophils Absolute: 0.4 K/uL (ref 0.0–0.5)
Eosinophils Relative: 4 %
HCT: 43.6 % (ref 39.0–52.0)
Hemoglobin: 15.1 g/dL (ref 13.0–17.0)
Immature Granulocytes: 1 %
Lymphocytes Relative: 35 %
Lymphs Abs: 2.9 K/uL (ref 0.7–4.0)
MCH: 31.5 pg (ref 26.0–34.0)
MCHC: 34.6 g/dL (ref 30.0–36.0)
MCV: 90.8 fL (ref 80.0–100.0)
Monocytes Absolute: 1 K/uL (ref 0.1–1.0)
Monocytes Relative: 12 %
Neutro Abs: 4 K/uL (ref 1.7–7.7)
Neutrophils Relative %: 47 %
Platelets: 308 K/uL (ref 150–400)
RBC: 4.8 MIL/uL (ref 4.22–5.81)
RDW: 12.6 % (ref 11.5–15.5)
WBC: 8.4 K/uL (ref 4.0–10.5)
nRBC: 0 % (ref 0.0–0.2)

## 2024-06-23 LAB — TROPONIN T, HIGH SENSITIVITY
Troponin T High Sensitivity: <15 ng/L (ref <19)
Troponin T High Sensitivity: <15 ng/L (ref <19)
Troponin T High Sensitivity: <15 ng/L (ref <19)

## 2024-06-23 LAB — LIPASE, BLOOD: Lipase: 33 U/L (ref 11–51)

## 2024-06-23 MED ORDER — IOHEXOL 350 MG/ML SOLN
100.0000 mL | Freq: Once | INTRAVENOUS | Status: AC | PRN
  Administered 2024-06-23: 100 mL via INTRAVENOUS

## 2024-06-23 MED ORDER — ASPIRIN 81 MG PO CHEW
324.0000 mg | CHEWABLE_TABLET | Freq: Once | ORAL | Status: AC
  Administered 2024-06-23: 324 mg via ORAL
  Filled 2024-06-23: qty 4

## 2024-06-23 MED ORDER — ONDANSETRON HCL 4 MG/2ML IJ SOLN
4.0000 mg | Freq: Once | INTRAMUSCULAR | Status: AC
  Administered 2024-06-23: 4 mg via INTRAVENOUS
  Filled 2024-06-23: qty 2

## 2024-06-23 MED ORDER — FENTANYL CITRATE PF 50 MCG/ML IJ SOSY
50.0000 ug | PREFILLED_SYRINGE | Freq: Once | INTRAMUSCULAR | Status: AC
  Administered 2024-06-23: 50 ug via INTRAVENOUS
  Filled 2024-06-23: qty 1

## 2024-06-23 MED ORDER — SODIUM CHLORIDE 0.9 % IV SOLN: Freq: Once | INTRAVENOUS | Status: AC

## 2024-06-23 MED ORDER — NITROGLYCERIN 0.4 MG SL SUBL
0.4000 mg | SUBLINGUAL_TABLET | SUBLINGUAL | Status: DC | PRN
  Administered 2024-06-23 (×3): 0.4 mg via SUBLINGUAL
  Filled 2024-06-23: qty 1

## 2024-06-23 NOTE — ED Provider Notes (Signed)
 [facility] Provider Note   CSN: [id number] Arrival date & time: 06/23/24  9547     Patient presents with: Chest Pain   Anthony Dean is a 48 y.o. male.   The history is provided by the patient and medical records.  Chest Pain Anthony Dean is a 48 y.o. male who presents to the Emergency Department complaining of chest pain.  He presents to the emergency department for evaluation of left-sided chest pain that woke him from sleep around 3 AM.  Pain is described as sharp and constant.  It is also located in his left back.  He has associated nausea, diaphoresis.  He had a similar episode that woke him on Wednesday night but it did improve after he took his blood pressure medication.  He has a history of hypertension, hyperlipidemia.  No tobacco, rare alcohol, no drug use.  No history of DVT/PE.  No family history of cardiac disease.     Prior to Admission medications   Not on File    Allergies: Patient has no allergy information on record.    Review of Systems  Cardiovascular:  Positive for chest pain.  All other systems reviewed and are negative.   Updated Vital Signs BP (!) 164/111 (BP Location: Right Arm)   Pulse 71   Temp 98.4 F (36.9 C) (Oral)   Resp (!) 22   Ht 5' 10 (1.778 m)   Wt 112.5 kg   SpO2 100%   BMI 35.58 kg/m   Physical Exam Vitals and nursing note reviewed.  Constitutional:      Appearance: He is well-developed. He is diaphoretic.  HENT:     Head: Normocephalic and atraumatic.  Cardiovascular:     Rate and Rhythm: Normal rate and regular rhythm.     Heart sounds: No murmur heard. Pulmonary:     Effort: Pulmonary effort is normal. No respiratory distress.     Breath sounds: Normal breath sounds.  Abdominal:     Palpations: Abdomen is soft.     Tenderness: There is no abdominal tenderness. There is no guarding or rebound.  Musculoskeletal:        General: No swelling or tenderness.     Comments:  2+ radial and DP pulses bilaterally.   Skin:    General: Skin is warm.  Neurological:     Mental Status: He is alert and oriented to person, place, and time.  Psychiatric:        Behavior: Behavior normal.     (all labs ordered are listed, but only abnormal results are displayed) Labs Reviewed - No data to display  EKG: None  Radiology: No results found.   Procedures   Medications Ordered in the ED  aspirin  chewable tablet 324 mg (has no administration in time range)  nitroGLYCERIN  (NITROSTAT ) SL tablet 0.4 mg (has no administration in time range)                                    Medical Decision Making Amount and/or Complexity of Data Reviewed Labs: ordered. Radiology: ordered.  Risk OTC drugs. Prescription drug management.   Patient with history of hypertension, hyperlipidemia here for evaluation of left-sided chest pain that woke him from sleep.  He is diaphoretic.  EKG with nonspecific changes.  Will treat with aspirin , nitroglycerin  pending additional workup. Patient initially had partial relief with nitroglycerin , then developed worsening chest  pain and diaphoresis.  BP improved to 120s/80s.  Repeat EKG is stable.  Will treat with fluid bolus.    Patient is feeling improved after IV fluids and pain medications.  Initial troponin is negative.  He does have mild elevation in BUN and creatinine when compared to priors.  Given his description of symptoms a CTA was obtained.  Patient care transferred pending additional troponin and reevaluation.     Final diagnoses:  None    ED Discharge Orders     None          Griselda Norris, MD 06/23/24 2238

## 2024-06-23 NOTE — Discharge Instructions (Signed)
 Please follow up at the listed cardiology appointment. Your heart workup and labs were normal today. Please follow up with cardiology as mentioned. If you have any worsening symptoms, please return to an ER.

## 2024-06-23 NOTE — ED Notes (Signed)
 ED Provider at bedside.

## 2024-06-23 NOTE — ED Provider Notes (Signed)
 Patient signed out to me by previous provider. Please refer to their note for full HPI.  Briefly this is a 48 year old male with past medical history of HTN who presented to the emergency department with left-sided chest pain that woke him up from sleep.  Described as tight, sharp radiating to the left back, not pleuritic.  Arrival EKG shows nonspecific changes in 3, aVF as well as V3.  No previous to compare to.  Reportedly diaphoretic on arrival.  Initial troponin negative.    Patient signed out pending repeat troponin, CT dissection study.   ED Course: Repeat troponin is negative, CT dissection study mentions calcified left coronary arteries and possible gallstone in the gallbladder neck but is otherwise unremarkable.  Patient is nontender in the right upper quadrant, LFTs, bilirubin and lipase are normal.  Doubt any emergent nature of this possible incidental finding.  Consulted cardiology, Dr. Shlomo.  We reviewed the patient's presentation and results.  Given the visual findings on the CT study with the nature of his discomfort she recommends repeat troponin at 9 AM.  If this is negative we will plan for outpatient follow-up with cardiology which she will work on setting up with the PA.  Repeat troponin is negative.  The cardiology team has already updated the patient's follow-up appointments.  He feels well with no ongoing symptoms or concerns.  Patient at this time appears safe and stable for discharge and close outpatient follow up. Discharge plan and strict return to ED precautions discussed, patient verbalizes understanding and agreement.   Bari Roxie HERO, DO 06/23/24 1434

## 2024-06-23 NOTE — ED Triage Notes (Signed)
 Pt states he woke about an hour ago with left sided chest tightness radiating into his back. Pt states it happened a few nights ago too but went away. States with that episode he broke out into a sweat, denies that with this episode. States he took 2 bp pills & some pepto at home prior to arrival without relief.

## 2024-06-23 NOTE — ED Notes (Addendum)
 Reviewed discharge instructions and follow up appt. Pt advised of appt scheduled with cardiology. States understanding. Stable at discharge Ambulatory at discharge with family

## 2024-06-26 ENCOUNTER — Ambulatory Visit (HOSPITAL_COMMUNITY)
Admission: RE | Admit: 2024-06-26 | Discharge: 2024-06-26 | Disposition: A | Discharge status: Home / Self Care | Source: Ambulatory Visit | Attending: Cardiovascular Disease | Admitting: Cardiovascular Disease

## 2024-06-26 ENCOUNTER — Ambulatory Visit: Payer: Self-pay | Admitting: Cardiovascular Disease

## 2024-06-26 ENCOUNTER — Ambulatory Visit: Attending: Cardiovascular Disease | Admitting: Cardiovascular Disease

## 2024-06-26 VITALS — BP 140/98 | HR 53 | Ht 71.0 in | Wt 250.0 lb

## 2024-06-26 DIAGNOSIS — I2510 Atherosclerotic heart disease of native coronary artery without angina pectoris: Secondary | ICD-10-CM | POA: Diagnosis not present

## 2024-06-26 DIAGNOSIS — I517 Cardiomegaly: Secondary | ICD-10-CM | POA: Insufficient documentation

## 2024-06-26 DIAGNOSIS — R072 Precordial pain: Secondary | ICD-10-CM | POA: Diagnosis present

## 2024-06-26 DIAGNOSIS — R079 Chest pain, unspecified: Secondary | ICD-10-CM | POA: Diagnosis not present

## 2024-06-26 DIAGNOSIS — I10 Essential (primary) hypertension: Secondary | ICD-10-CM | POA: Insufficient documentation

## 2024-06-26 DIAGNOSIS — E782 Mixed hyperlipidemia: Secondary | ICD-10-CM

## 2024-06-26 DIAGNOSIS — E785 Hyperlipidemia, unspecified: Secondary | ICD-10-CM | POA: Insufficient documentation

## 2024-06-26 MED ORDER — NITROGLYCERIN 0.4 MG SL SUBL
0.8000 mg | SUBLINGUAL_TABLET | Freq: Once | SUBLINGUAL | Status: AC
Start: 2024-06-26 — End: 2024-06-26
  Administered 2024-06-26: 0.8 mg via SUBLINGUAL

## 2024-06-26 MED ORDER — IOHEXOL 350 MG/ML SOLN
100.0000 mL | Freq: Once | INTRAVENOUS | Status: AC | PRN
  Administered 2024-06-26: 100 mL via INTRAVENOUS

## 2024-06-26 NOTE — Assessment & Plan Note (Signed)
 History of essential hypertension with blood pressure measured today 140/98.  He is on lisinopril and metoprolol.  Takes his blood pressure at home and normally is within normal limits.

## 2024-06-26 NOTE — Assessment & Plan Note (Addendum)
 History of hyperlipidemia on statin therapy.  Lipid profile performed 6 months ago revealed total cholesterol 192, LDL 132 and HDL 32.

## 2024-06-26 NOTE — Progress Notes (Signed)
 06/26/2024 Anthony Dean   Nov 28, 1975  [id number]  Primary Physician Jolee Madelin Patch, MD Primary Cardiologist: Dorn JINNY Lesches MD [facility] [place] [place], [place]  HPI:  Anthony Dean is a 47 y.o. 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  He was referred by the emergency room because recent presentation for chest pain.  History factors include treated hypertension and hyperlipidemia.  He does not smoke.  There is no family history of heart disease.  Never had a heart attack or stroke.  Fairly active and walks 3 to 4 miles a day at work.  He was awakened from sleep 1 week ago with chest pain rating to his back for last approximately 2 to 3 hours resolved spontaneously.  He was awakened again on 06/23/2024 with similar symptoms and went to the ER.  His troponins were negative.  EKG showed no acute changes.  Chest CT showed no evidence of pulmonary embolism or dissection.  It did show coronary calcification and changes may be related to cholelithiasis.   Current Meds  Medication Sig   fenofibrate (TRICOR) 145 MG tablet Take 145 mg by mouth daily.   lisinopril (ZESTRIL) 30 MG tablet Take 30 mg by mouth daily.   metoprolol tartrate (LOPRESSOR) 50 MG tablet Take 50 mg by mouth daily.   Multiple Vitamin (MULTI-VITAMIN DAILY PO) Take by mouth daily.   Omega-3 Fatty Acids (FISH OIL) 1000 MG CAPS Take 1 g by mouth daily.   omeprazole (PRILOSEC) 40 MG capsule Take 40 mg by mouth daily.   SYNTHROID 125 MCG tablet Take 125 mcg by mouth daily.   tadalafil (CIALIS) 20 MG tablet Take 20 mg by mouth daily as needed.     No Known Allergies  Social History   Socioeconomic History   Marital status: Single    Spouse name: Not on file   Number of children: Not on file   Years of education: Not on file   Highest education level: Not on file  Occupational History   Not on file  Tobacco Use   Smoking status: Never   Smokeless  tobacco: Never  Substance and Sexual Activity   Alcohol use: Yes   Drug use: Never   Sexual activity: Yes  Other Topics Concern   Not on file  Social History Narrative   Not on file   Social Drivers of Health   Financial Resource Strain: Low Risk  (09/29/2021)   Received from [facility] visits prior to 01/22/2023.   Overall Financial Resource Strain (CARDIA)    Difficulty of Paying Living Expenses: Not hard at all  Food Insecurity: Low Risk  (12/08/2023)   [facility]   Hunger Vital Sign    Within the past 12 months, you worried that your food would run out before you got money to buy more: Never true    Within the past 12 months, the food you bought just didn't last and you didn't have money to get more. : Never true  Transportation Needs: No Transportation Needs (12/08/2023)   Received from [facility]    In the past 12 months, has lack of reliable transportation kept you from medical appointments, meetings, work or from getting things needed for daily living? : No  Physical Activity: Insufficiently Active (09/29/2021)   Received from [facility] visits prior to 01/22/2023.   Exercise Vital Sign  On average, how many days per week do you engage in moderate to strenuous exercise (like a brisk walk)?: 4 days    On average, how many minutes do you engage in exercise at this level?: 30 min  Stress: No Stress Concern Present (09/29/2021)   Received from [facility] visits prior to 01/22/2023.   [employer] of Occupational Health - Occupational Stress Questionnaire    Feeling of Stress : Not at all  Social Connections: Moderately Integrated (09/29/2021)   Received from [facility] visits prior to 01/22/2023.   Social Connection and Isolation Panel    In a typical week, how many times do you talk on the phone with family, friends, or neighbors?: Twice a week    How  often do you get together with friends or relatives?: Once a week    How often do you attend church or religious services?: 1 to 4 times per year    Do you belong to any clubs or organizations such as church groups, unions, fraternal or athletic groups, or school groups?: No    How often do you attend meetings of the clubs or organizations you belong to?: Never    Are you married, widowed, divorced, separated, never married, or living with a partner?: Married  Intimate Partner Violence: Not At Risk (09/29/2021)   Received from [facility] visits prior to 01/22/2023.   Humiliation, Afraid, Rape, and Kick questionnaire    Within the last year, have you been afraid of your partner or ex-partner?: No    Within the last year, have you been humiliated or emotionally abused in other ways by your partner or ex-partner?: No    Within the last year, have you been kicked, hit, slapped, or otherwise physically hurt by your partner or ex-partner?: No    Within the last year, have you been raped or forced to have any kind of sexual activity by your partner or ex-partner?: No     Review of Systems: General: negative for chills, fever, night sweats or weight changes.  Cardiovascular: negative for chest pain, dyspnea on exertion, edema, orthopnea, palpitations, paroxysmal nocturnal dyspnea or shortness of breath Dermatological: negative for rash Respiratory: negative for cough or wheezing Urologic: negative for hematuria Abdominal: negative for nausea, vomiting, diarrhea, bright red blood per rectum, melena, or hematemesis Neurologic: negative for visual changes, syncope, or dizziness All other systems reviewed and are otherwise negative except as noted above.    Blood pressure (!) 140/98, pulse (!) 53, height 5' 11 (1.803 m), weight 250 lb (113.4 kg), SpO2 98%.  General appearance: alert and no distress Neck: no adenopathy, no carotid bruit, no JVD, supple, symmetrical, trachea  midline, and thyroid not enlarged, symmetric, no tenderness/mass/nodules Lungs: clear to auscultation bilaterally Heart: regular rate and rhythm, S1, S2 normal, no murmur, click, rub or gallop Extremities: extremities normal, atraumatic, no cyanosis or edema Pulses: 2+ and symmetric Skin: Skin color, texture, turgor normal. No rashes or lesions Neurologic: Grossly normal  EKG not performed today      ASSESSMENT AND PLAN:   Essential hypertension History of essential hypertension with blood pressure measured today 140/98.  He is on lisinopril and metoprolol.  Takes his blood pressure at home and normally is within normal limits.  Hyperlipidemia History of hyperlipidemia on statin therapy.  Lipid profile performed 6 months ago revealed total cholesterol 192, LDL 132 and HDL 32.  Chest pain of uncertain etiology Patient had chest pain  1 week ago that awakened him from sleep.  Lasted for 2 to 3 hours and then resolved spontaneously.  He had another episode this past Saturday that awaken him from sleep as well.  He went to the ER where his workup was unrevealing.  Troponins were negative.  EKG showed no acute changes.  Chest CT did show coronary calcification as well as changes that may be consistent with cholelithiasis.  I am going to get a coronary CTA to further evaluate.     Dorn DOROTHA Lesches MD FACP,FACC,FAHA, [facility] 06/26/2024 2:26 PM

## 2024-06-26 NOTE — Patient Instructions (Addendum)
 Medication Instructions:  Your physician recommends that you continue on your current medications as directed. Please refer to the Current Medication list given to you today.  *If you need a refill on your cardiac medications before your next appointment, please call your pharmacy*  Testing/Procedures: See below  Follow-Up: At [facility], you and your health needs are our priority.  As part of our continuing mission to provide you with exceptional heart care, our providers are all part of one team.  This team includes your primary Cardiologist (physician) and Advanced Practice Providers or APPs (Physician Assistants and Nurse Practitioners) who all work together to provide you with the care you need, when you need it.  Your next appointment:   3 month(s)  Provider:   Dorn Lesches, MD     We recommend signing up for the patient portal called MyChart.  Sign up information is provided on this After Visit Summary.  MyChart is used to connect with patients for Virtual Visits (Telemedicine).  Patients are able to view lab/test results, encounter notes, upcoming appointments, etc.  Non-urgent messages can be sent to your provider as well.   To learn more about what you can do with MyChart, go to [email or web address].   Other Instructions   Your cardiac CT will be scheduled at the below location:    [facility] [address]  [place], [place] [address]   If scheduled at the [facility] at [address], please enter the parking lot using the [address] entrance and use the FREE valet service at the patient drop-off area. Enter the building and check-in with registration on the main floor.   Please follow these instructions carefully (unless otherwise directed):  An IV will be required for this test and Nitroglycerin  will be given.  Hold all erectile dysfunction medications at least 3 days (72 hrs) prior to test. (Ie  viagra, cialis, sildenafil, tadalafil, etc)   On the Night Before the Test: Be sure to Drink plenty of water. Do not consume any caffeinated/decaffeinated beverages or chocolate 12 hours prior to your test. Do not take any antihistamines 12 hours prior to your test.   On the Day of the Test: Drink plenty of water until 1 hour prior to the test. Do not eat any food 1 hour prior to test. You may take your regular medications prior to the test.  Take metoprolol (Lopressor) two hours prior to test. If you take Furosemide/Hydrochlorothiazide/Spironolactone/Chlorthalidone, please HOLD on the morning of the test. Patients who wear a continuous glucose monitor MUST remove the device prior to scanning.        After the Test: Drink plenty of water. After receiving IV contrast, you may experience a mild flushed feeling. This is normal. On occasion, you may experience a mild rash up to 24 hours after the test. This is not dangerous. If this occurs, you can take Benadryl 25 mg, Zyrtec, Claritin, or Allegra and increase your fluid intake. (Patients taking Tikosyn should avoid Benadryl, and may take Zyrtec, Claritin, or Allegra) If you experience trouble breathing, this can be serious. If it is severe call 911 IMMEDIATELY. If it is mild, please call our office.  We will call to schedule your test 2-4 weeks out understanding that some insurance companies will need an authorization prior to the service being performed.   For more information and frequently asked questions, please visit our website : [email or web address]  For non-scheduling related questions, please contact the cardiac  imaging nurse navigator should you have any questions/concerns: Cardiac Imaging Nurse Navigators Direct Office Dial: [phone]   For scheduling needs, including cancellations and rescheduling, please call [place], [phone].

## 2024-06-26 NOTE — Assessment & Plan Note (Signed)
 Patient had chest pain 1 week ago that awakened him from sleep.  Lasted for 2 to 3 hours and then resolved spontaneously.  He had another episode this past Saturday that awaken him from sleep as well.  He went to the ER where his workup was unrevealing.  Troponins were negative.  EKG showed no acute changes.  Chest CT did show coronary calcification as well as changes that may be consistent with cholelithiasis.  I am going to get a coronary CTA to further evaluate.

## 2024-09-26 ENCOUNTER — Encounter: Payer: Self-pay | Admitting: Cardiovascular Disease

## 2024-09-26 ENCOUNTER — Ambulatory Visit: Attending: Cardiology | Admitting: Cardiovascular Disease

## 2024-09-26 VITALS — BP 130/88 | HR 70 | Ht 71.0 in | Wt 252.6 lb

## 2024-09-26 DIAGNOSIS — R079 Chest pain, unspecified: Secondary | ICD-10-CM

## 2024-09-26 DIAGNOSIS — E782 Mixed hyperlipidemia: Secondary | ICD-10-CM | POA: Diagnosis not present

## 2024-09-26 DIAGNOSIS — I10 Essential (primary) hypertension: Secondary | ICD-10-CM

## 2024-09-26 MED ORDER — ROSUVASTATIN CALCIUM 10 MG PO TABS
10.0000 mg | ORAL_TABLET | Freq: Every day | ORAL | 3 refills | Status: AC
Start: 2024-09-26 — End: 2024-12-25

## 2024-09-26 NOTE — Assessment & Plan Note (Signed)
 History of hyperlipidemia and hypertriglyceridemia currently on fenofibrate.  His his most recent lipid profile performed 07/09/2024 by his PCP revealed total cholesterol 169, LDL 112 and HDL 34.  Given his mildly elevated coronary calcium score of 64, his LDL goal should be less than 70.  Will start rosuvastatin 10 mg a day and we will recheck a lipid liver profile in 3 months.

## 2024-09-26 NOTE — Patient Instructions (Addendum)
 Medication Instructions:  Your physician has recommended you make the following change in your medication:   -Start rosuvastatin (crestor) 10mg  once daily.  *If you need a refill on your cardiac medications before your next appointment, please call your pharmacy*  Lab Work: Your physician recommends that you return for lab work in: 3 months for FASTING lipid/liver panel  If you have labs (blood work) drawn today and your tests are completely normal, you will receive your results only by: MyChart Message (if you have MyChart) OR A paper copy in the mail If you have any lab test that is abnormal or we need to change your treatment, we will call you to review the results.   Follow-Up: At [facility] you and your health needs are our priority.  As part of our continuing mission to provide you with exceptional heart care, our providers are all part of one team.  This team includes your primary Cardiologist (physician) and Advanced Practice Providers or APPs (Physician Assistants and Nurse Practitioners) who all work together to provide you with the care you need, when you need it.  Your next appointment:   12 month(s)  Provider:   Dorn Lesches, MD   We recommend signing up for the patient portal called MyChart.  Sign up information is provided on this After Visit Summary.  MyChart is used to connect with patients for Virtual Visits (Telemedicine).  Patients are able to view lab/test results, encounter notes, upcoming appointments, etc.  Non-urgent messages can be sent to your provider as well.   To learn more about what you can do with MyChart, go to [email or web address].   Other Instructions Heart-Healthy Eating Plan Eating a healthy diet is important for the health of your heart. A heart-healthy eating plan includes: Eating less unhealthy fats. Eating more healthy fats. Eating less salt in your food. Salt is also called sodium. Making other changes in your  diet. Cooking Avoid frying your food. Try to bake, boil, grill, or broil it instead. You can also reduce fat by: Removing the skin from poultry. Removing all visible fats from meats. Steaming vegetables in water or broth. Meal planning  At meals, divide your plate into four equal parts: Fill one-half of your plate with vegetables and green salads. Fill one-fourth of your plate with whole grains. Fill one-fourth of your plate with lean protein foods. Eat 2-4 cups of vegetables per day. One cup of vegetables is: 1 cup (91 g) broccoli or cauliflower florets. 2 medium carrots. 1 large bell pepper. 1 large sweet potato. 1 large tomato. 1 medium white potato. 2 cups (150 g) raw leafy greens. Eat 1-2 cups of fruit per day. One cup of fruit is: 1 small apple 1 large banana 1 cup (237 g) mixed fruit, 1 large orange,  cup (82 g) dried fruit, 1 cup (240 mL) 100% fruit juice. Eat more foods that have soluble fiber. These are apples, broccoli, carrots, beans, peas, and barley. Try to get 20-30 g of fiber per day. Eat 4-5 servings of nuts, legumes, and seeds per week: 1 serving of dried beans or legumes equals  cup (90 g) cooked. 1 serving of nuts is  oz (12 almonds, 24 pistachios, or 7 walnut halves). 1 serving of seeds equals  oz (8 g). General information Eat more home-cooked food. Eat less restaurant, buffet, and fast food. Limit or avoid alcohol. Limit foods that are high in starch and sugar. Avoid fried foods. Lose weight if you are overweight. Keep track  of how much salt (sodium) you eat. This is important if you have high blood pressure. Ask your doctor to tell you more about this. Try to add vegetarian meals each week. Fats Choose healthy fats. These include olive oil and canola oil, flaxseeds, walnuts, almonds, and seeds. Eat more omega-3 fats. These include salmon, mackerel, sardines, tuna, flaxseed oil, and ground flaxseeds. Try to eat fish at least 2 times each  week. Check food labels. Avoid foods with trans fats or high amounts of saturated fat. Limit saturated fats. These are often found in animal products, such as meats, butter, and cream. These are also found in plant foods, such as palm oil, palm kernel oil, and coconut oil. Avoid foods with partially hydrogenated oils in them. These have trans fats. Examples are stick margarine, some tub margarines, cookies, crackers, and other baked goods. What foods should I eat? Fruits All fresh, canned (in natural juice), or frozen fruits. Vegetables Fresh or frozen vegetables (raw, steamed, roasted, or grilled). Green salads. Grains Most grains. Choose whole wheat and whole grains most of the time. Rice and pasta, including brown rice and pastas made with whole wheat. Meats and other proteins Lean, well-trimmed beef, veal, pork, and lamb. Chicken and turkey without skin. All fish and shellfish. Wild duck, rabbit, pheasant, and venison. Egg whites or low-cholesterol egg substitutes. Dried beans, peas, lentils, and tofu. Seeds and most nuts. Dairy Low-fat or nonfat cheeses, including ricotta and mozzarella. Skim or 1% milk that is liquid, powdered, or evaporated. Buttermilk that is made with low-fat milk. Nonfat or low-fat yogurt. Fats and oils Non-hydrogenated (trans-free) margarines. Vegetable oils, including soybean, sesame, sunflower, olive, peanut, safflower, corn, canola, and cottonseed. Salad dressings or mayonnaise made with a vegetable oil. Beverages Mineral water. Coffee and tea. Diet carbonated beverages. Sweets and desserts Sherbet, gelatin, and fruit ice. Small amounts of dark chocolate. Limit all sweets and desserts. Seasonings and condiments All seasonings and condiments. The items listed above may not be a complete list of foods and drinks you can eat. Contact a dietitian for more options. What foods should I avoid? Fruits Canned fruit in heavy syrup. Fruit in cream or butter sauce.  Fried fruit. Limit coconut. Vegetables Vegetables cooked in cheese, cream, or butter sauce. Fried vegetables. Grains Breads that are made with saturated or trans fats, oils, or whole milk. Croissants. Sweet rolls. Donuts. High-fat crackers, such as cheese crackers. Meats and other proteins Fatty meats, such as hot dogs, ribs, sausage, bacon, rib-eye roast or steak. High-fat deli meats, such as salami and bologna. Caviar. Domestic duck and goose. Organ meats, such as liver. Dairy Cream, sour cream, cream cheese, and creamed cottage cheese. Whole-milk cheeses. Whole or 2% milk that is liquid, evaporated, or condensed. Whole buttermilk. Cream sauce or high-fat cheese sauce. Yogurt that is made from whole milk. Fats and oils Meat fat, or shortening. Cocoa butter, hydrogenated oils, palm oil, coconut oil, palm kernel oil. Solid fats and shortenings, including bacon fat, salt pork, lard, and butter. Nondairy cream substitutes. Salad dressings with cheese or sour cream. Beverages Regular sodas and juice drinks with added sugar. Sweets and desserts Frosting. Pudding. Cookies. Cakes. Pies. Milk chocolate or white chocolate. Buttered syrups. Full-fat ice cream or ice cream drinks. The items listed above may not be a complete list of foods and drinks to avoid. Contact a dietitian for more information. Summary Heart-healthy meal planning includes eating less unhealthy fats, eating more healthy fats, and making other changes in your diet. Eat a balanced  diet. This includes fruits and vegetables, low-fat or nonfat dairy, lean protein, nuts and legumes, whole grains, and heart-healthy oils and fats. This information is not intended to replace advice given to you by your health care provider. Make sure you discuss any questions you have with your health care provider. Document Revised: 12/14/2021 Document Reviewed: 12/14/2021 Elsevier Patient Education  2024 [employer].

## 2024-09-26 NOTE — Assessment & Plan Note (Signed)
 History of atypical chest pain for which I saw him 3 months ago.  He said no recurrent symptoms.  He did have a coronary calcium score performed 06/26/2024 which was 64 with mild nonobstructive plaque in the LAD.  Based on this we will focus on risk factor modification

## 2024-09-26 NOTE — Assessment & Plan Note (Signed)
 History of essential hypertension with blood pressure measured today at 130/88.  He is on lisinopril and metoprolol.

## 2024-09-26 NOTE — Progress Notes (Signed)
 09/26/2024 [name]   June 04, 1976  [id number]  Primary Physician Jolee Madelin Patch, MD Primary Cardiologist: Dorn JINNY Lesches MD [facility] [place] [place], [place]  HPI:  Anthony Dean is a 48 y.o.  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 He was referred by the emergency room because recent presentation for chest pain.  I last saw him in the office 06/26/2024.  History factors include treated hypertension and hyperlipidemia. He does not smoke. There is no family history of heart disease. Never had a heart attack or stroke. Fairly active and walks 3 to 4 miles a day at work. He was awakened from sleep 1 week ago with chest pain rating to his back for last approximately 2 to 3 hours resolved spontaneously. He was awakened again on 06/23/2024 with similar symptoms and went to the ER. His troponins were negative. EKG showed no acute changes. Chest CT showed no evidence of pulmonary embolism or dissection. It did show coronary calcification and changes may be related to cholelithiasis.  Since I saw him 3 months ago he said no recurrent episodes of chest pain.  He did have a coronary calcium score performed 06/26/2024 which was 64, with mild nonobstructive plaque in the LAD.  His most recent lipid profile performed by his PCP 07/09/2024 revealed total cholesterol 169, LDL of 112 and HDL 34, not at goal for secondary prevention.   Current Meds  Medication Sig   fenofibrate (TRICOR) 145 MG tablet Take 145 mg by mouth daily.   lisinopril (ZESTRIL) 30 MG tablet Take 30 mg by mouth daily.   metoprolol tartrate (LOPRESSOR) 50 MG tablet Take 50 mg by mouth daily.   Multiple Vitamin (MULTI-VITAMIN DAILY PO) Take by mouth daily.   Omega-3 Fatty Acids (FISH OIL) 1000 MG CAPS Take 1 g by mouth daily.   omeprazole (PRILOSEC) 40 MG capsule Take 40 mg by mouth daily.   SYNTHROID 137 MCG tablet Take 137 mcg by mouth daily before breakfast.    tadalafil (CIALIS) 20 MG tablet Take 20 mg by mouth daily as needed.     No Known Allergies  Social History   Socioeconomic History   Marital status: Single    Spouse name: Not on file   Number of children: Not on file   Years of education: Not on file   Highest education level: Not on file  Occupational History   Not on file  Tobacco Use   Smoking status: Never   Smokeless tobacco: Never  Substance and Sexual Activity   Alcohol use: Yes   Drug use: Never   Sexual activity: Yes  Other Topics Concern   Not on file  Social History Narrative   Not on file   Social Drivers of Health   Financial Resource Strain: Low Risk  (09/29/2021)   Received from [facility] visits prior to 01/22/2023.   Overall Financial Resource Strain (CARDIA)    Difficulty of Paying Living Expenses: Not hard at all  Food Insecurity: Low Risk  (07/09/2024)   [facility] from [facility]   Hunger Vital Sign    Within the past 12 months, you worried that your food would run out before you got money to buy more: Never true    Within the past 12 months, the food you bought just didn't last and you didn't have money to get more. : Never true  Transportation Needs: No Transportation Needs (07/09/2024)  Received from [facility]    In the past 12 months, has lack of reliable transportation kept you from medical appointments, meetings, work or from getting things needed for daily living? : No  Physical Activity: Insufficiently Active (09/29/2021)   Received from [facility] visits prior to 01/22/2023.   Exercise Vital Sign    On average, how many days per week do you engage in moderate to strenuous exercise (like a brisk walk)?: 4 days    On average, how many minutes do you engage in exercise at this level?: 30 min  Stress: No Stress Concern Present (09/29/2021)   Received from [facility] visits prior to 01/22/2023.   [employer] - Occupational Stress Questionnaire    Feeling of Stress : Not at all  Social Connections: Moderately Integrated (09/29/2021)   Received from [facility] visits prior to 01/22/2023.   Social Connection and Isolation Panel    In a typical week, how many times do you talk on the phone with family, friends, or neighbors?: Twice a week    How often do you get together with friends or relatives?: Once a week    How often do you attend church or religious services?: 1 to 4 times per year    Do you belong to any clubs or organizations such as church groups, unions, fraternal or athletic groups, or school groups?: No    How often do you attend meetings of the clubs or organizations you belong to?: Never    Are you married, widowed, divorced, separated, never married, or living with a partner?: Married  Intimate Partner Violence: Not At Risk (09/29/2021)   Received from [facility] visits prior to 01/22/2023.   Humiliation, Afraid, Rape, and Kick questionnaire    Within the last year, have you been afraid of your partner or ex-partner?: No    Within the last year, have you been humiliated or emotionally abused in other ways by your partner or ex-partner?: No    Within the last year, have you been kicked, hit, slapped, or otherwise physically hurt by your partner or ex-partner?: No    Within the last year, have you been raped or forced to have any kind of sexual activity by your partner or ex-partner?: No     Review of Systems: General: negative for chills, fever, night sweats or weight changes.  Cardiovascular: negative for chest pain, dyspnea on exertion, edema, orthopnea, palpitations, paroxysmal nocturnal dyspnea or shortness of breath Dermatological: negative for rash Respiratory: negative for cough or wheezing Urologic: negative for hematuria Abdominal: negative for nausea, vomiting, diarrhea, bright red blood per rectum,  melena, or hematemesis Neurologic: negative for visual changes, syncope, or dizziness All other systems reviewed and are otherwise negative except as noted above.    Blood pressure 130/88, pulse 70, height 5' 11 (1.803 m), weight 252 lb 9.6 oz (114.6 kg), SpO2 96%.  General appearance: alert and no distress Neck: no adenopathy, no carotid bruit, no JVD, supple, symmetrical, trachea midline, and thyroid not enlarged, symmetric, no tenderness/mass/nodules Lungs: clear to auscultation bilaterally Heart: regular rate and rhythm, S1, S2 normal, no murmur, click, rub or gallop Extremities: extremities normal, atraumatic, no cyanosis or edema Pulses: 2+ and symmetric Skin: Skin color, texture, turgor normal. No rashes or lesions Neurologic: Grossly normal  EKG EKG Interpretation Date/Time:  Wednesday September 26 2024 08:48:33 EST Ventricular Rate:  70 PR  Interval:  140 QRS Duration:  96 QT Interval:  380 QTC Calculation: 410 R Axis:   26  Text Interpretation: Normal sinus rhythm Incomplete right bundle branch block T wave abnormality, consider inferior ischemia When compared with ECG of 23-Jun-2024 05:00, PREVIOUS ECG IS PRESENT Confirmed by Court Carrier [phone]) on 09/26/2024 9:08:27 AM    ASSESSMENT AND PLAN:   Essential hypertension History of essential hypertension with blood pressure measured today at 130/88.  He is on lisinopril and metoprolol.  Hyperlipidemia History of hyperlipidemia and hypertriglyceridemia currently on fenofibrate.  His his most recent lipid profile performed 07/09/2024 by his PCP revealed total cholesterol 169, LDL 112 and HDL 34.  Given his mildly elevated coronary calcium score of 64, his LDL goal should be less than 70.  Will start rosuvastatin 10 mg a day and we will recheck a lipid liver profile in 3 months.  Chest pain of uncertain etiology History of atypical chest pain for which I saw him 3 months ago.  He said no recurrent symptoms.  He did have a  coronary calcium score performed 06/26/2024 which was 64 with mild nonobstructive plaque in the LAD.  Based on this we will focus on risk factor modification     Carrier DOROTHA Court MD [facility], [facility] 09/26/2024 9:17 AM
